# Patient Record
Sex: Male | Born: 1990 | Race: White | Hispanic: No | Marital: Single
Health system: Southern US, Community
[De-identification: ages and names within clinical notes are randomized; demographics above are authoritative.]

---
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
## 2012-11-27 IMAGING — CR NECK SOFT TISSUES - 1+ VIEW
1 series · 2 of 2 positions shown · non-contrast
Comparison: none

REASON FOR EXAM: ESOPHAGEAL FB
COMMENTS:   May transport without cardiac monitor

[Series 1: view not recorded · 0.17mm/px · 2 of 2 slices shown]
[im 1/2]
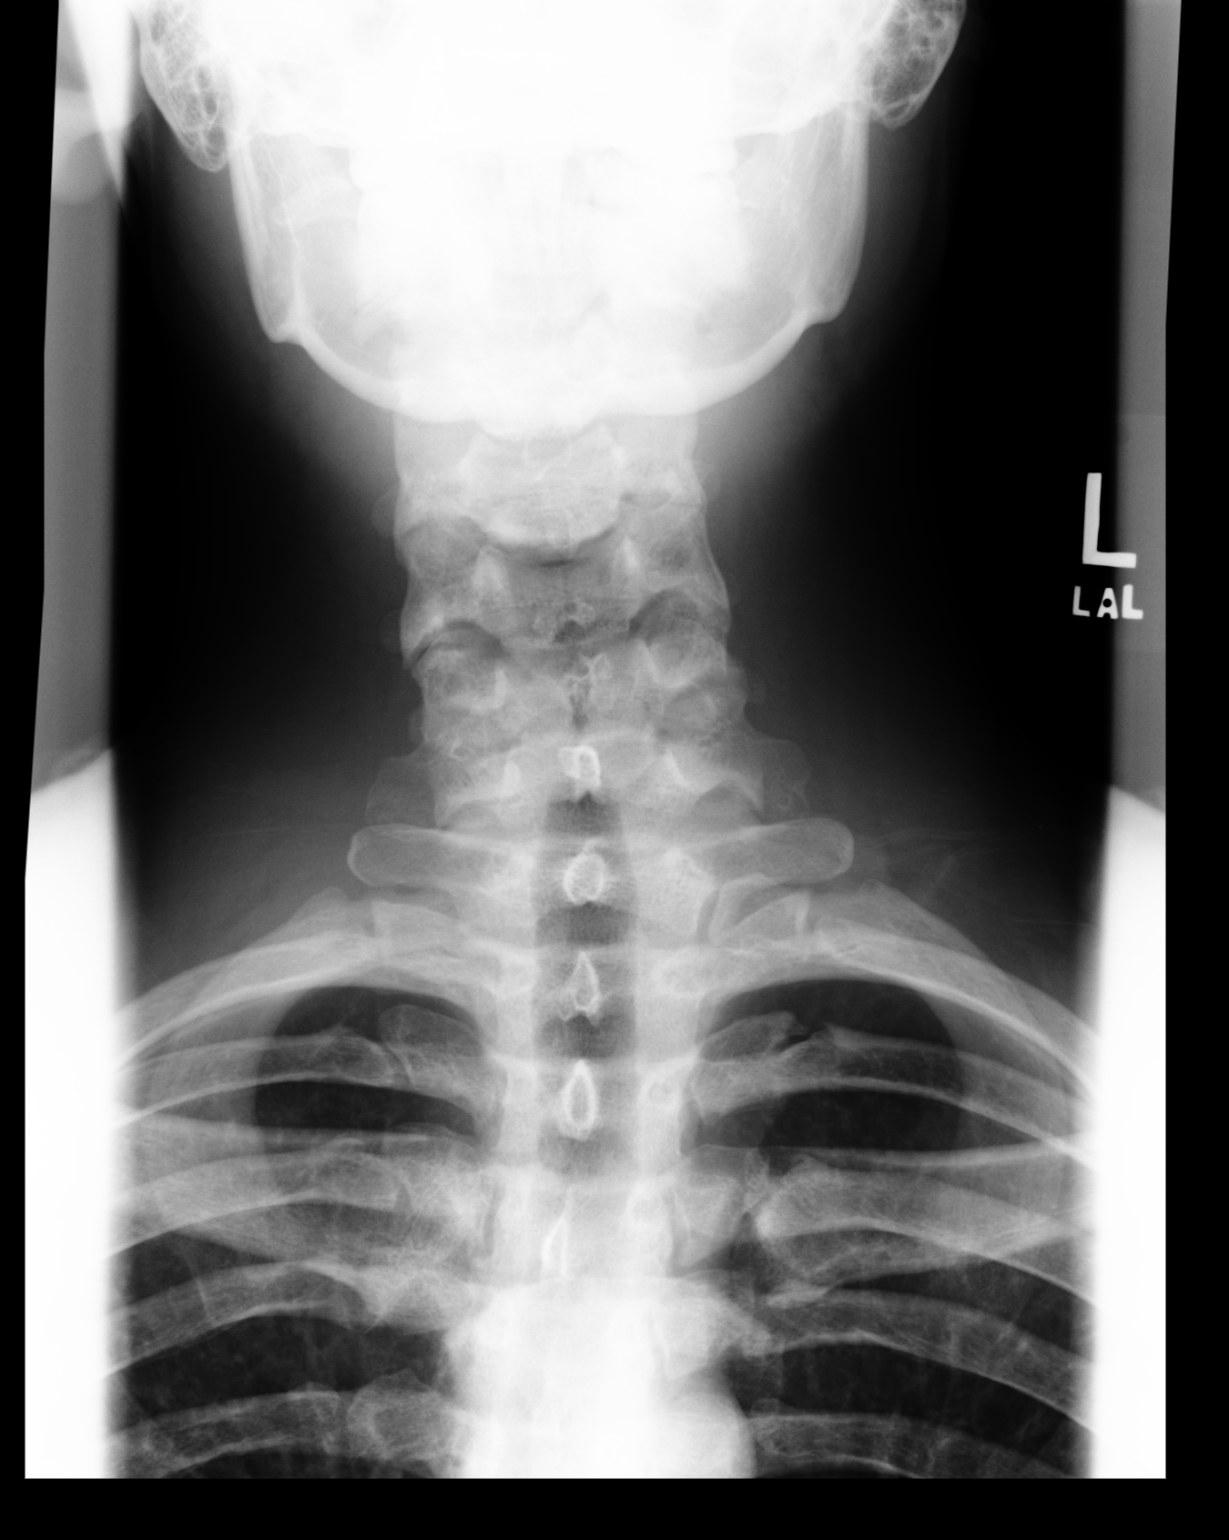
[im 2/2]
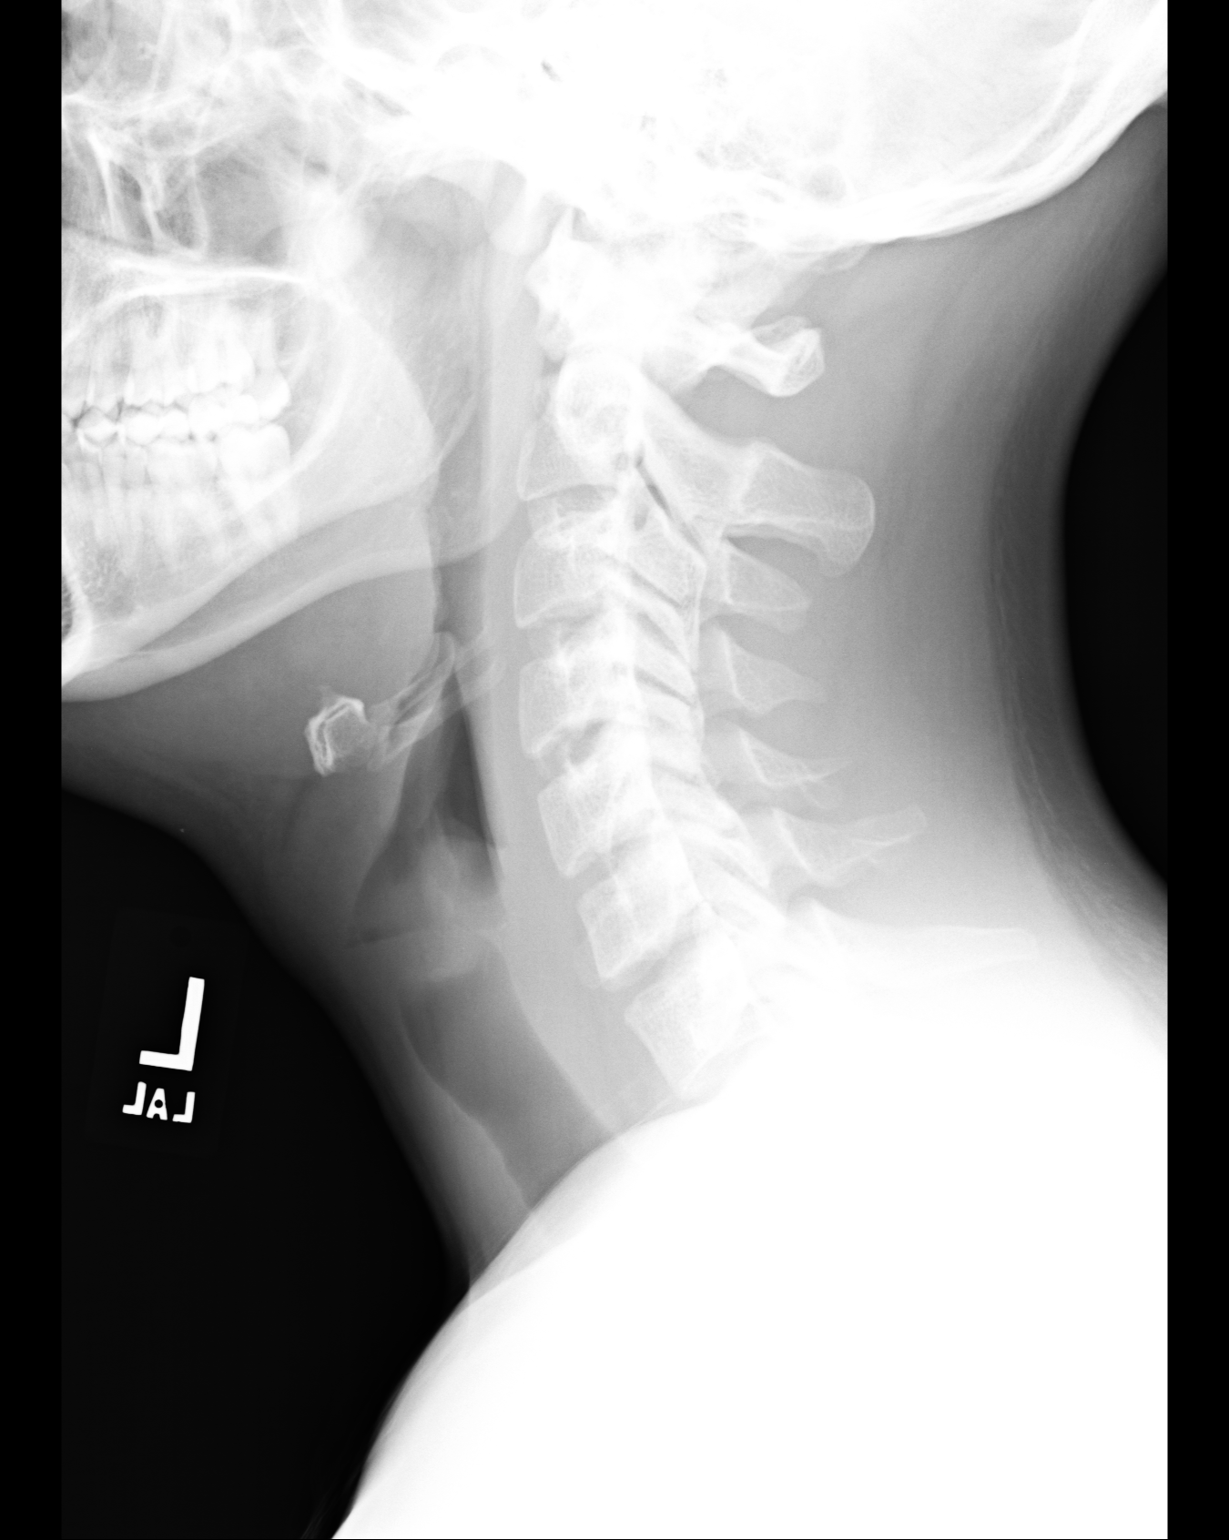

[2 of 2 positions shown; findings below may reference images not displayed]

PROCEDURE:     DXR - DXR SOFT TISSUE NECK  - December 01, 2010  [DATE]

RESULT:     The prevertebral soft tissues appear to be normal. The
epiglottis is not enlarged. The adenoids appear to be unremarkable. The bony
structures are grossly normal. A definite radiopaque foreign body is not
evident. There is some minimal irregularity and density anteriorly from the
C5 vertebral body which is of uncertain significance. Correlate with
symptoms.
IMPRESSION: Please see above.

## 2013-01-18 ENCOUNTER — Ambulatory Visit: Payer: Self-pay | Admitting: Unknown Physician Specialty

## 2013-01-18 LAB — COMPREHENSIVE METABOLIC PANEL
Alkaline Phosphatase: 91 U/L (ref 50–136)
Anion Gap: 4 — ABNORMAL LOW (ref 7–16)
BUN: 20 mg/dL — ABNORMAL HIGH (ref 7–18)
Bilirubin,Total: 0.7 mg/dL (ref 0.2–1.0)
Co2: 29 mmol/L (ref 21–32)
EGFR (African American): >60
EGFR (Non-African Amer.): >60
Glucose: 88 mg/dL (ref 65–99)
Osmolality: 278 (ref 275–301)
SGOT(AST): 19 U/L (ref 15–37)
SGPT (ALT): 29 U/L (ref 12–78)
Total Protein: 7.5 g/dL (ref 6.4–8.2)

## 2013-01-18 LAB — CBC WITH DIFFERENTIAL/PLATELET
Basophil #: 0.1 x10 3/mm 3 (ref 0.0–0.1)
Basophil %: 0.7 %
Eosinophil #: 0.4 x10 3/mm 3 (ref 0.0–0.7)
Eosinophil %: 2.7 %
HCT: 44.7 % (ref 40.0–52.0)
HGB: 15.6 g/dL (ref 13.0–18.0)
Lymphocyte %: 11.4 %
Lymphs Abs: 1.8 x10 3/mm 3 (ref 1.0–3.6)
MCH: 28.2 pg (ref 26.0–34.0)
MCHC: 34.9 g/dL (ref 32.0–36.0)
MCV: 81 fL (ref 80–100)
Monocyte #: 1.5 x10 3/mm — ABNORMAL HIGH (ref 0.2–1.0)
Monocyte %: 9.9 %
Neutrophil #: 11.7 x10 3/mm 3 — ABNORMAL HIGH (ref 1.4–6.5)
Neutrophil %: 75.3 %
Platelet: 191 x10 3/mm 3 (ref 150–440)
RBC: 5.54 x10 6/mm 3 (ref 4.40–5.90)
RDW: 13.4 % (ref 11.5–14.5)
WBC: 15.5 x10 3/mm 3 — ABNORMAL HIGH (ref 3.8–10.6)

## 2013-02-18 ENCOUNTER — Ambulatory Visit: Payer: Self-pay | Admitting: Unknown Physician Specialty

## 2013-03-25 ENCOUNTER — Ambulatory Visit: Payer: Self-pay | Admitting: Unknown Physician Specialty

## 2013-04-29 ENCOUNTER — Ambulatory Visit: Payer: Self-pay | Admitting: Unknown Physician Specialty

## 2014-12-15 NOTE — Consult Note (Signed)
PATIENT NAME:  Bobby Lawrence, Bobby Lawrence MR#:  [id number] DATE OF BIRTH:  07/07/1991  DATE OF CONSULTATION:  01/18/2013  REFERRING PHYSICIAN:  Emergency Room.   CONSULTING PHYSICIAN: Dr. Gaylyn Cheers, MD/Bernis Schreur Jerelene Redden, ANP.  REASON FOR CONSULTATION: Acute dysphagia.   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    HISTORY OF PRESENT ILLNESS: The patient reports that he ate a chicken dinner about 1900 without any problem  and swallowed fine afterwards. At 2200 he swallowed a generic Allegra pill which is larger than his brand-name pill with a large gulp of water. This pill immediately hung up in the esophagus, and from that point on he was not able to swallow anything else. He has been unable to swallow his saliva. He presented to the Emergency Room with this history and has developed episodes of nausea, and vomiting up saliva early this morning.   He does have a history of dysphagia starting about 8th grade when a carrot became hung up. He presented to the Emergency Room and was treated successfully with Glucagon. He did fine until 2012 when he had a chicken impaction that required EGD by Dr. Dionne Milo. The EGD on 12/01/2010 showed a bolus of food in the middle third of the esophagus that was removed with a single pass of a Roth net.  There was significant resistance felt on passage of the scope through the mid-portion of the esophagus, and further advancement was not attempted. There was a small superficial linear tear noted at 30 cm, as well as multiple rings suggesting eosinophilic esophagitis. Biopsies were taken and returned with fragments of inflamed, stratified squamous epithelium, with mixed neutrophils and eosinophils. There is exudate consistent with ulcer and a fragment of smooth muscle. The patient was treated with about a 6-week course of omeprazole, and was  later referred to an allergist. He saw Dr. Kathyrn Sheriff who diagnosed a MILD WHEAT AND PEANUT ALLERGY. The patient did have repeat EGD a few months later at Dr. Michaelle Copas office. Mother is not certain if dilatation was performed at that time or not. The patient has been trying to avoid peanut products, but he has not attempted to avoid wheat products. He has had no prior use of swallowed steroids. The patient reports he had been swallowing fine until this event occurred yesterday. He does utilize NSAIDs.   PAST MEDICAL HISTORY:  1.  History of dysphagia, with EGD suggesting eosinophilic esophagitis.  2.  Environmental allergies.  3.  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   PAST SURGICAL HISTORY: Arthrogram, left shoulder.   MEDICATIONS: 1.  Allegra, p.r.n.  2.  Delsym, for cough p.r.n.  3.  Ibuprofen 200 mg 3 tablets about twice a week as needed for joint pain.   ALLERGIES: NKDA . Wheat and peanut allergy reported as mild  HABITS: Negative tobacco. Occasional social alcohol.   FAMILY HISTORY: Positive for hypertension, hyperlipidemia. Negative for dysphagia, esophageal or GI malignancy.   REVIEW OF SYSTEMS: Twelve systems reviewed, with negative findings with the exception as noted in history of present illness. He also had a mild headache last night. Yesterday, some mild nasal congestion typical of his allergies. He gets occasional sinus infections once a year. He denies any recent sinus infection. Very slight cough yesterday, attributed to the postnasal drainage.   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  PHYSICAL EXAMINATION: VITAL SIGNS: 99.3, 87, 139/72. Respirations 20.  GENERAL: Well-appearing, well-nourished, muscular Caucasian male, NAD. The patient does get up in the middle of the interview and attempt emesis in the sink. He is spitting up his saliva in a bottle.  HEENT: Head is normocephalic. Conjunctivae pink.  Sclerae anicteric.  NECK: Supple. Trachea midline.  HEART: Heart tones S1, S2 without murmur or gallop.  LUNGS: CTA. Respirations are eupneic.  ABDOMEN: Soft, flat, nontender, without hepatosplenomegaly or masses.   RECTAL: Deferred.  SKIN: Warm and dry, without rash or edema.  MUSCULOSKELETAL: Intact. No joint swelling, inflammation. Gait is stable.  PSYCH: Affect and mood within normal. Appears somewhat fatigued.  NEUROLOGIC: Cranial nerves II-XII grossly intact.   IMPRESSION: The patient with acute episode of Allegra pill dysphagia, 2200. He had eaten a chicken dinner at 1900. It is possible that the offending impaction bolus could be a piece of chicken from earlier. The patient does have a history of dysphagia with chicken, 2012, and EGD found esophageal ring and eosinophilic esophagitis. He had a second EGD performed by Dr. Michaelle Copas office; results unavailable. Mother is not certain that he has ever been dilated.   PLAN: 1.  Recommend emergent EGD with or without dilatation, depending on what Dr. Vira Agar finds. If he has a luminal abnormality most likely will not be able to dilate at this time.  2.  Recommend CBC, MET-C, H. pylori with labs stick for IV line insertion.  3. I discussed options for treating eosinophilic esophagitis with chronic proton pump inhibitor, chewing well, eating slowly, and swallow inhaled steroids. Further recommendations pending the  luminal findings. He will need outpatient follow up for Eosinophilic esophagitis.   This case was discussed with Dr. Vira Agar in collaboration of care. Thank you for the consultation.    ____________________________ Janalyn Harder Jerelene Redden, ANP (Adult Nurse Practitioner) in collaboration with Dr. Manya Silvas MD.  kam:dm D: 01/18/2013 10:02:24 ET T: 01/18/2013 10:35:51 ET JOB#: [id number]  cc: Joelene Millin A. Jerelene Redden, ANP (Adult Nurse Practitioner), <Dictator> Janalyn Harder Sherlyn Hay, MSN, ANP-BC Adult Nurse Practitioner ELECTRONICALLY  SIGNED 01/18/2013 16:05

## 2014-12-15 NOTE — Consult Note (Signed)
Brief Consult Note: Diagnosis: Dysphagia with history of Eosinophilic Esophagitis and prior EGD x 2.   Patient was seen by consultant.   Orders entered.   Comments: Pt gives history of acute generic Allegera pill hanging up in his esophagus since 2200 last night. He ate chicken dinner at 1900 without difficulty and swallowed fine until he took the pill with a big gulp of liquid. He immediately felt that the pill would not pass and he has been unable to swallow his saliva ever since. He has developed wretching this am- productive of only saliva-  to be treated with Zofran.   He had his first  dysphagia with a carrot in the 8th grade treated with glucagon. He had dysphagia with chicken in 2012 with prolonged impaction requiring EGD per Dr. Niel HummerIftikhar who dx EE. He had small superficial esophageal linear tear and rings. He underwent a follow up outpt EGD - unsure if he was ever dilated. He saw an allergist and was dx with weak wheat and peanut allergies. He avoids peanut products. He took omeprazole for a 6 wk course in 2012, no trial of steroids.  Positve NSAIDs.  Full consult note to follow.  Electronic Signatures: Rowan BlaseMills, Tymara Saur Ann (NP)  (Signed 27-May-14 08:42)  Authored: Brief Consult Note   Last Updated: 27-May-14 08:42 by Rowan BlaseMills, Kristiann Noyce Ann (NP)
